# Patient Record
Sex: Male | Born: 1945 | Race: White | Hispanic: Yes | Marital: Married | Smoking: Never smoker
Health system: Southern US, Community
[De-identification: ages and names within clinical notes are randomized; demographics above are authoritative.]

---

## 2007-02-02 ENCOUNTER — Emergency Department (HOSPITAL_COMMUNITY): Admission: EM | Admit: 2007-02-02 | Discharge: 2007-02-02 | Payer: Self-pay | Admitting: Emergency Medicine

## 2016-04-05 ENCOUNTER — Ambulatory Visit (INDEPENDENT_AMBULATORY_CARE_PROVIDER_SITE_OTHER): Payer: Self-pay

## 2016-04-05 ENCOUNTER — Ambulatory Visit (INDEPENDENT_AMBULATORY_CARE_PROVIDER_SITE_OTHER): Payer: Self-pay | Admitting: Family Medicine

## 2016-04-05 VITALS — BP 110/70 | HR 60 | Temp 98.5°F | Resp 16 | Ht 63.75 in | Wt 177.6 lb

## 2016-04-05 DIAGNOSIS — M5442 Lumbago with sciatica, left side: Secondary | ICD-10-CM

## 2016-04-05 MED ORDER — CYCLOBENZAPRINE HCL 10 MG PO TABS: 10.0000 mg | ORAL_TABLET | Freq: Three times a day (TID) | ORAL | 0 refills | Status: AC | PRN

## 2016-04-05 MED ORDER — PREDNISONE 10 MG PO TABS: 30.0000 mg | ORAL_TABLET | Freq: Every day | ORAL | 0 refills | Status: AC

## 2016-04-05 NOTE — Progress Notes (Signed)
Patient ID: Aram BeechamGonzalo Tuggle, male    DOB: 1945/10/31, 70 y.o.   MRN: [id number]  PCP: No primary care provider on file.  Chief Complaint  Patient presents with  . Back Pain    lower back    Subjective:   HPI [age] year male, presents for evaluation of low back pain times 8 days ago. He reports he was at work bending down to pick-up a cement bag weighting about 40 lbs. Upon bending he experienced a sudden "shooting pain in lower back and down left upper thigh".  He reported to his employer but he is employed in the state of [place]. He has taken Tylenol 500 mg for pain with minimal relief. The past two days , his pain has increased in consistency and he rates as 8/10.  History of left leg surgery and reports no new weakness since injury. Denies allergies to medication. He will be returning back to [place] today and wanted to be evaluated due to increasing pain over the last 48 hours.  Social History   Social History  . Marital status: Married    Spouse name: N/A  . Number of children: N/A  . Years of education: N/A   Occupational History  . Not on file.   Social History Main Topics  . Smoking status: Never Smoker  . Smokeless tobacco: Never Used  . Alcohol use Not on file  . Drug use: Unknown  . Sexual activity: Not on file   Other Topics Concern  . Not on file   Social History Narrative  . No narrative on file   No family history on file.  Review of Systems See HPI   There are no active problems to display for this patient.    Prior to Admission medications   Medication Sig Start Date End Date Taking? Authorizing Provider  acetaminophen (TYLENOL) 500 MG tablet Take 500 mg by mouth every 6 (six) hours as needed.   Yes Historical Provider, MD  diphenhydrAMINE (BENADRYL) 12.5 MG/5ML elixir Take 12.5 mg by mouth every 6 (six) hours as needed.   Yes Historical Provider, MD   Not on File     Objective:  Physical Exam  Constitutional: He is oriented to person,  place, and time. He appears well-developed and well-nourished.  HENT:  Head: Normocephalic and atraumatic.  Right Ear: External ear normal.  Left Ear: External ear normal.  Eyes: Conjunctivae are normal. Pupils are equal, round, and reactive to light.  Neck: Normal range of motion.  Cardiovascular: Normal rate, regular rhythm, normal heart sounds and intact distal pulses.   Pulmonary/Chest: Effort normal and breath sounds normal.  Musculoskeletal: He exhibits tenderness.  Complete forward flexion, extension and lateral rotation. Non reproducible tenderness of left lower back.   Neurological: He is alert and oriented to person, place, and time.  Skin: Skin is warm and dry.  Psychiatric: He has a normal mood and affect. His behavior is normal. Judgment and thought content normal.    Vitals:   04/05/16 1021  BP: 110/70  Pulse: 60  Resp: 16  Temp: 98.5 F (36.9 C)  Dg Lumbar Spine Complete  Result Date: 04/05/2016 CLINICAL DATA:  Acute low back injury with pain, lifting injury EXAM: LUMBAR SPINE - COMPLETE 4+ VIEW COMPARISON:  None available FINDINGS: Normal lumbar spine alignment. Preserved vertebral body heights. No acute compression fracture, wedge-shaped deformity or focal kyphosis. Minor endplate bony spurring at L4 and L5. Minor L5-S1 facet arthropathy posteriorly. Pedicles appear intact. Normal SI joints  for age. Normal bowel gas pattern. IMPRESSION: Minor lumbar endplate degenerative changes and lower lumbar facet arthropathy as above. No acute process by plain radiography. Electronically Signed   By: Judie Petit.  Shick M.D.   On: 04/05/2016 11:56   Assessment & Plan:  1. Acute left-sided low back pain with left-sided sciatica - DG Lumbar Spine Complete; Future  70 year old male presents with a 2 week history of low back pain which has increased in intensity times two days is likely related to muscle strain.  Will treat to decrease inflammation which will subsequently reduce pain. Digital  x-ray was unremarkable for anything acute. Chronic changes noted on xray discussed with patient and he was advised to follow-up with his primary care provider upon returning to [place].  Plan: Prednisone 30 mg times 5 days to reduce low back inflammation. Cyclobenzaprine 10 mg, three times daily as needed for muscle spasm pain.  Godfrey Pick. Tiburcio Pea, MSN, FNP-C [facility]

## 2016-04-05 NOTE — Patient Instructions (Addendum)
To reduce inflammation in low back Take prednisone 30 mg every morning with breakfast for 5 days.  For acute back pain and spasms: Take cyclobenzaprine 10 mg 3 times daily as needed. Caution this medication causes sleepiness and dizziness. Do not drive or operate heavy machinery while taking medication.   Avoid twisting, pushing, pulling movements.  Do not lift anything greater than 10 lbs until pain has resolved.  Alternate heat and ice for low back pain. If pain continues, follow-up with your primary care provider ain [place].   IF you received an x-ray today, you will receive an invoice from [facility]. Please contact [facility] at [phone] with questions or concerns regarding your invoice.   IF you received labwork today, you will receive an invoice from [employer]. Please contact [facility] at [phone] with questions or concerns regarding your invoice.   Our billing staff will not be able to assist you with questions regarding bills from these companies.  You will be contacted with the lab results as soon as they are available. The fastest way to get your results is to activate your My Chart account. Instructions are located on the last page of this paperwork. If you have not heard from [place] regarding the results in 2 weeks, please contact this office.     Dolor de espalda en adultos (Back Pain, Adult) El dolor de espalda es muy frecuente en los adultos.La causa del dolor de espalda es rara vez peligrosa y Chief Technology Officer a menudo mejora con el [place].Es posible que se desconozca la causa de esta afeccin. Algunas causas comunes son las siguientes:  Distensin de los msculos o ligamentos que sostienen la columna vertebral.  Chiropractor (degeneracin) de los discos vertebrales.  Artritis.  Lesiones directas en la espalda. En [employer], el dolor de espalda es recurrente. Como rara vez es peligroso, las personas pueden aprender a  Psychologist, clinical afeccin por s mismas. INSTRUCCIONES PARA EL CUIDADO EN EL HOGAR Controle su dolor de espalda a fin de Public house manager cambio. Las siguientes indicaciones ayudarn a Architectural technologist que pueda sentir:  Medical illustrator. Si permanece sentado o de pie en un mismo lugar durante mucho tiempo, se tensiona la espalda. No se siente, conduzca o permanezca de pie en un mismo lugar durante ms de 30 minutos seguidos. Realice caminatas cortas en superficies planas tan pronto como le sea posible.Trate de caminar un poco ms de Pharmacist, community.  Haga ejercicio regularmente como se lo haya indicado el mdico. El ejercicio ayuda a que su espalda se cure ms rpidamente. Tambin ayuda a prevenir futuras lesiones al [employer] fuertes y flexibles.  No permanezca en la cama.Si hace reposo ms de 1 a 2 das, puede demorar su recuperacin.  Preste atencin a su cuerpo al inclinarse y levantarse. Las posiciones ms cmodas son las que ejercen menos tensin en la espalda en recuperacin. Siempre use tcnicas apropiadas para levantar objetos, como por ejemplo:  Flexionar las rodillas.  Mantener la carga cerca del cuerpo.  No torcerse.  Encuentre una posicin cmoda para dormir. Use un colchn firme y recustese de costado con las rodillas ligeramente flexionadas. Si se recuesta [employer], coloque una almohada debajo de las rodillas.  Evite sentir ansiedad o estrs.El estrs aumenta la tensin muscular y puede empeorar el dolor de espalda.Es importante reconocer si se siente ansioso o estresado y aprender maneras de controlarlo, por ejemplo haciendo ejercicio.  Tome los medicamentos solamente como se lo haya indicado el  mdico. Los medicamentos de venta libre para Engineer, materialsaliviar el dolor y la inflamacin a menudo son los ms eficaces.El mdico puede recetarle relajantes musculares.Estos medicamentos ayudan a Primary school teachercalmar el dolor de modo que pueda reanudar ms rpidamente sus  actividades normales y el ejercicio saludable.  Aplique hielo sobre la zona lesionada.  Ponga el hielo en una bolsa plstica.  Coloque una toalla entre la piel y la bolsa de hielo.  Deje el hielo durante 20minutos, 2 a 3veces por da, durante los primeros 2 o 3das. Despus de eso, puede alternar el hielo y el calor para reducir Chief Technology Officerel dolor y los espasmos.  Mantenga un peso saludable. El exceso de peso ejerce presin adicional sobre la espalda y hace que resulte difcil mantener una buena [place]postura. SOLICITE ATENCIN MDICA SI:  Siente un dolor que no se alivia con reposo o medicamentos.  Siente mucho dolor que se extiende a las piernas o los glteos.  El dolor no mejora en una semana.  Siente dolor por la noche.  Pierde peso.  Siente escalofros o fiebre. SOLICITE ATENCIN MDICA DE INMEDIATO SI:   Tiene nuevos problemas para controlar la vejiga o los intestinos.  Siente debilidad o adormecimiento inusuales en los brazos o en las piernas.  Siente nuseas o vmitos.  Siente dolor abdominal.  Siente que va a desmayarse.   Esta informacin no tiene Theme park managercomo fin reemplazar el consejo del mdico. Asegrese de hacerle al mdico cualquier pregunta que tenga.   Document Released: 06/20/2005 Document Revised: 07/11/2014 Elsevier Interactive Patient Education [employer]2016 Elsevier Inc.

## 2017-11-16 IMAGING — DX DG LUMBAR SPINE COMPLETE 4+V
5 series · 5 of 5 positions shown · non-contrast
Comparison: None available

CLINICAL DATA: Acute low back injury with pain, lifting injury

EXAM:
LUMBAR SPINE - COMPLETE 4+ VIEW

[l-spine ap]
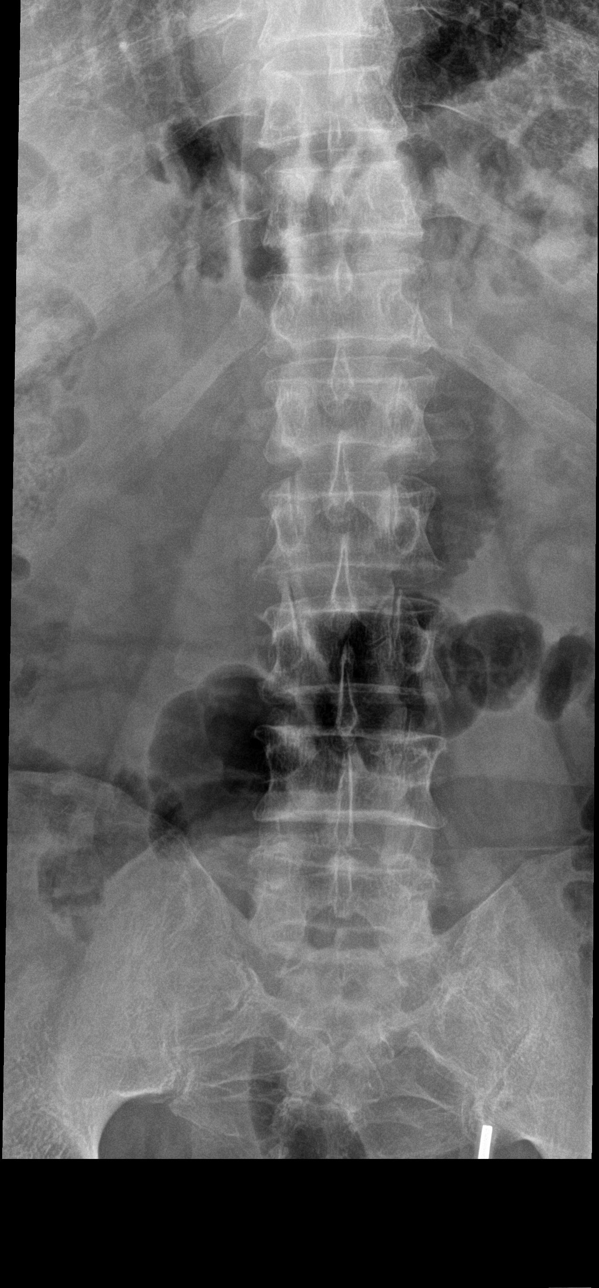

[l-spine obl (1 of 2)]
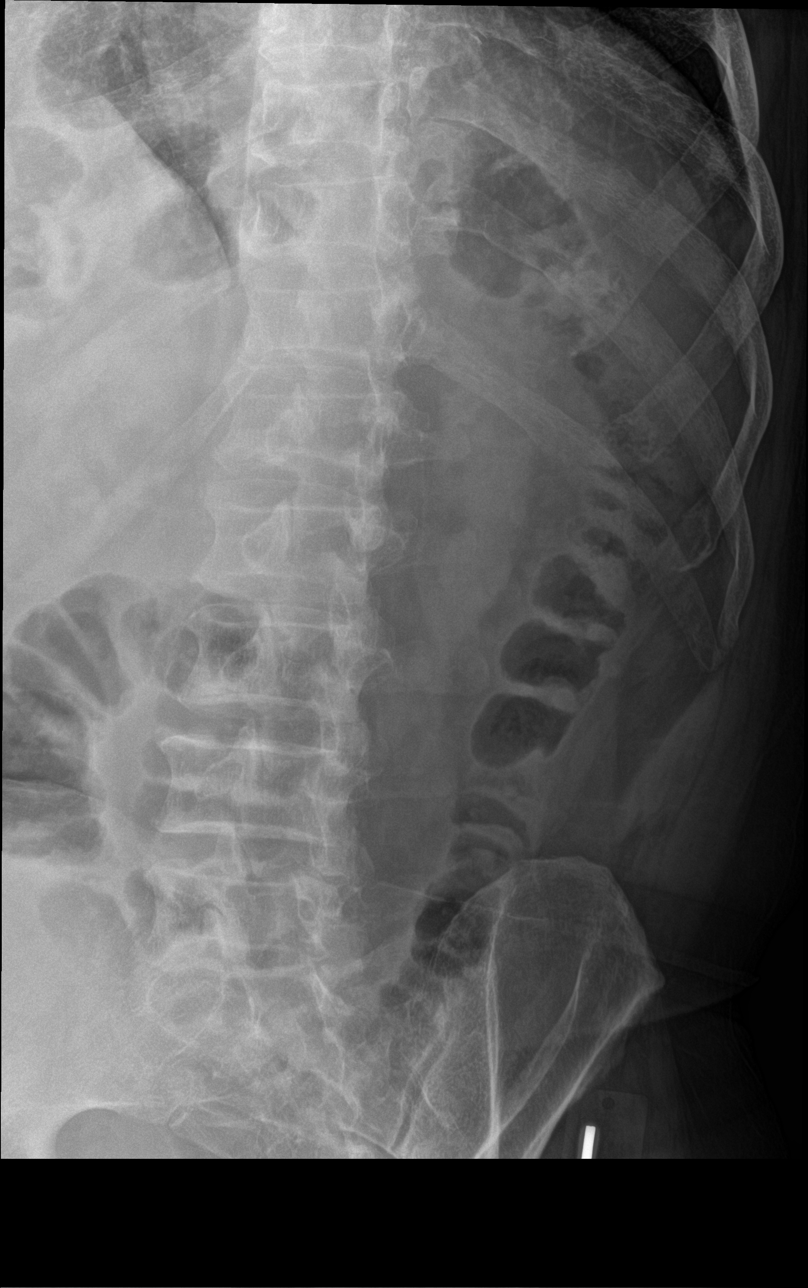

[l-spine obl (2 of 2)]
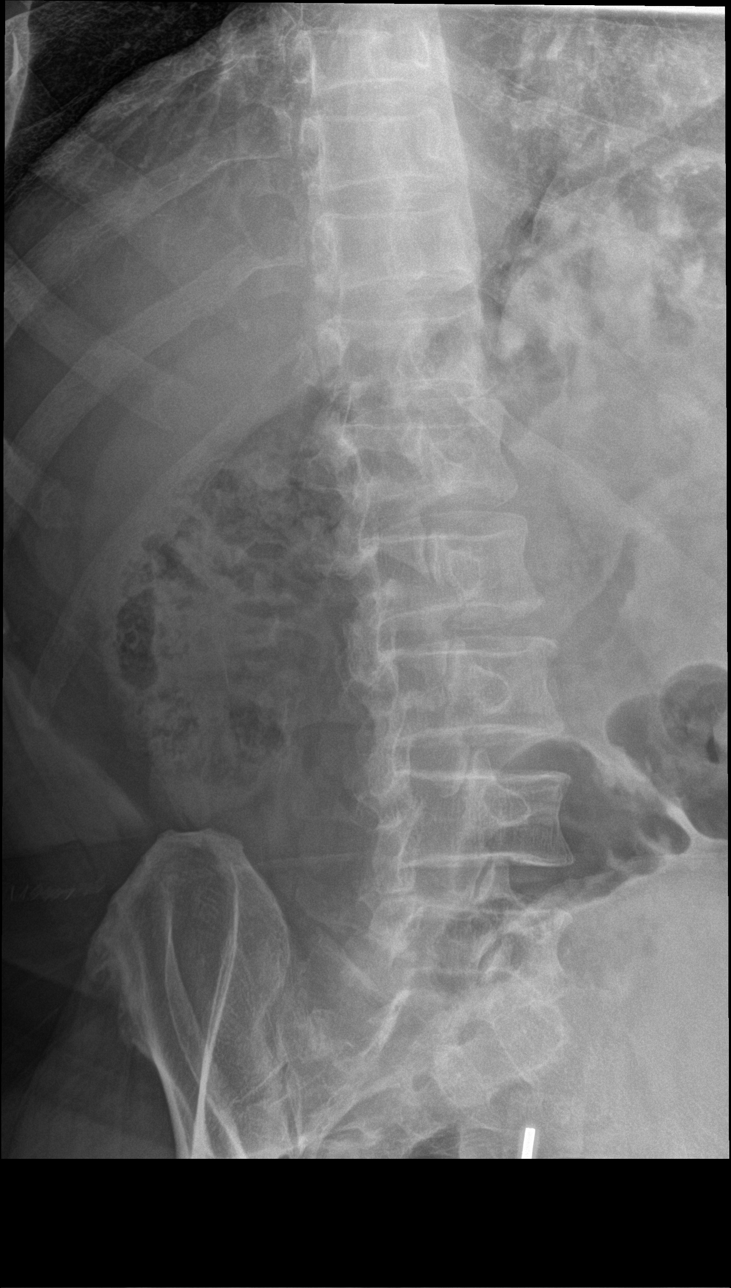

[l-spine lat]
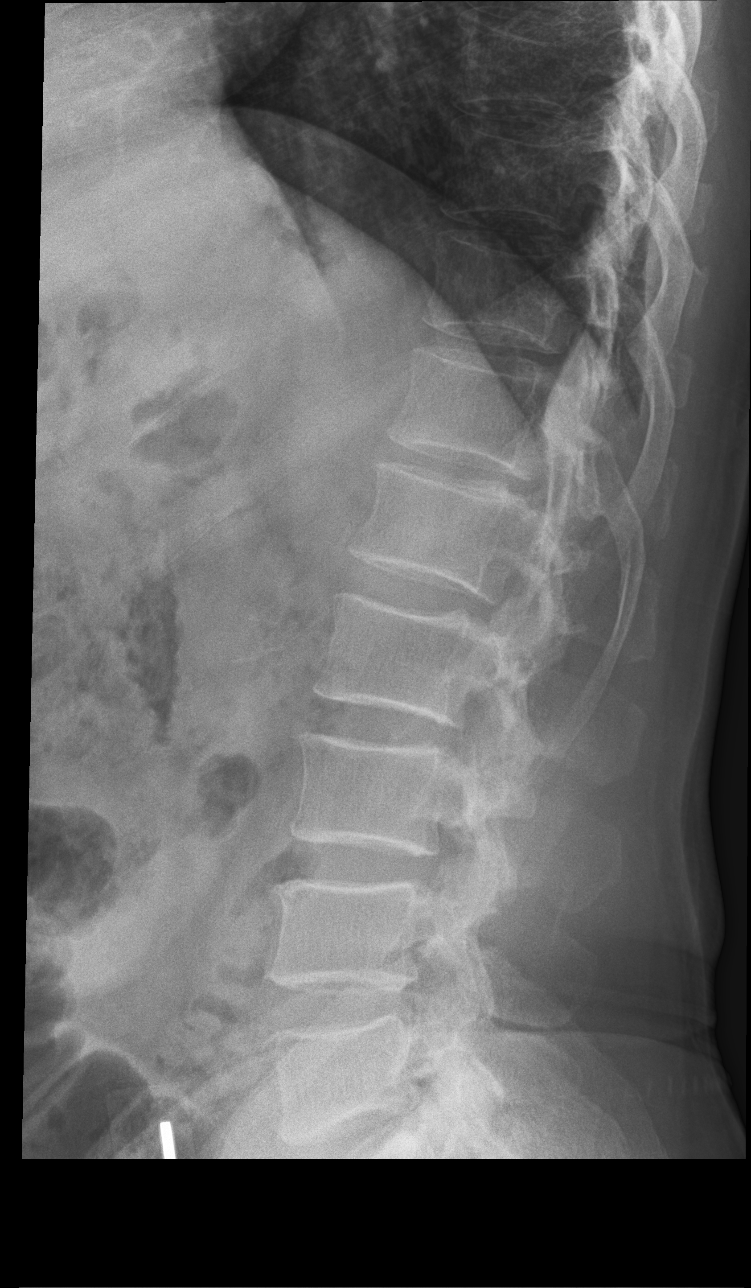

[l-spine l5-s1]
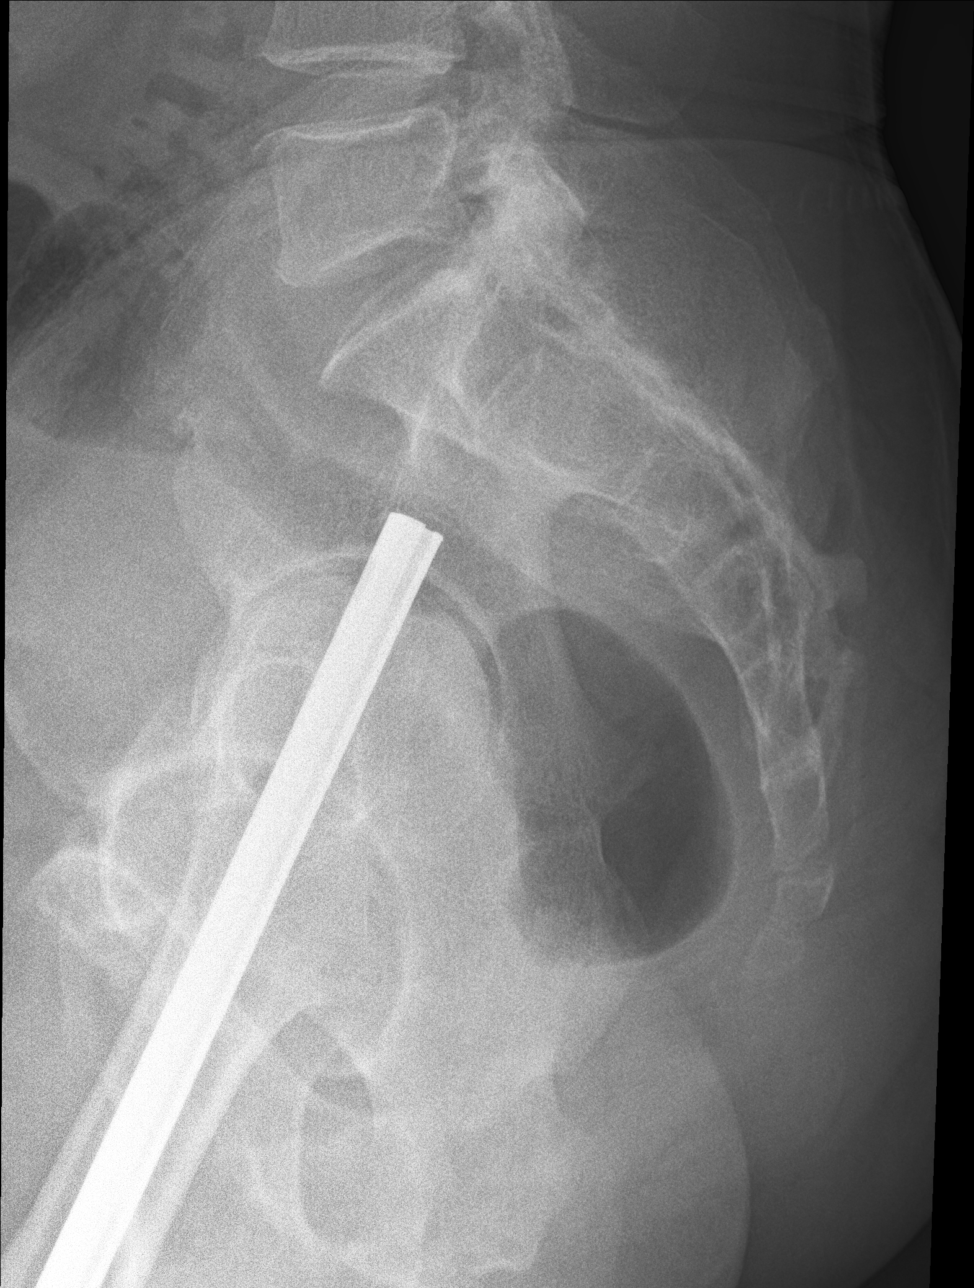

[5 of 5 positions shown; findings below may reference images not displayed]

FINDINGS: Normal lumbar spine alignment. Preserved vertebral body heights. No
acute compression fracture, wedge-shaped deformity or focal
kyphosis. Minor endplate bony spurring at L4 and L5. Minor L5-S1
facet arthropathy posteriorly. Pedicles appear intact. Normal SI
joints for age. Normal bowel gas pattern.
IMPRESSION: Minor lumbar endplate degenerative changes and lower lumbar facet
arthropathy as above.

No acute process by plain radiography.
